# Patient Record
Sex: Female | Born: 1940 | Race: Black or African American | Hispanic: No | Marital: Married | Smoking: Former smoker
Health system: Southern US, Community
[De-identification: ages and names within clinical notes are randomized; demographics above are authoritative.]

## PROBLEM LIST (undated history)

## (undated) HISTORY — PX: ABDOMINAL HYSTERECTOMY: SHX81

## (undated) HISTORY — PX: OOPHORECTOMY: SHX86

---

## 2006-11-15 ENCOUNTER — Ambulatory Visit: Payer: Self-pay | Admitting: Family Medicine

## 2006-11-27 ENCOUNTER — Ambulatory Visit: Payer: Self-pay | Admitting: Family Medicine

## 2008-01-01 ENCOUNTER — Ambulatory Visit: Payer: Self-pay | Admitting: Family Medicine

## 2009-03-02 ENCOUNTER — Ambulatory Visit: Payer: Self-pay | Admitting: Family Medicine

## 2010-01-08 ENCOUNTER — Ambulatory Visit: Payer: Self-pay | Admitting: Family Medicine

## 2010-03-08 ENCOUNTER — Ambulatory Visit: Payer: Self-pay | Admitting: Family Medicine

## 2011-04-05 ENCOUNTER — Ambulatory Visit: Payer: Self-pay | Admitting: Family Medicine

## 2013-06-11 ENCOUNTER — Ambulatory Visit: Payer: Self-pay | Admitting: Family Medicine

## 2014-10-28 ENCOUNTER — Ambulatory Visit: Payer: Self-pay | Admitting: Family Medicine

## 2015-10-22 ENCOUNTER — Other Ambulatory Visit: Payer: Self-pay | Admitting: Family Medicine

## 2015-10-22 DIAGNOSIS — Z1231 Encounter for screening mammogram for malignant neoplasm of breast: Secondary | ICD-10-CM

## 2015-11-03 ENCOUNTER — Ambulatory Visit
Admission: RE | Admit: 2015-11-03 | Discharge: 2015-11-03 | Disposition: A | Discharge status: Home / Self Care | Payer: Medicare Other | Source: Ambulatory Visit | Attending: Family Medicine | Admitting: Family Medicine

## 2015-11-03 DIAGNOSIS — Z1231 Encounter for screening mammogram for malignant neoplasm of breast: Secondary | ICD-10-CM | POA: Diagnosis present

## 2015-11-09 ENCOUNTER — Other Ambulatory Visit: Payer: Self-pay | Admitting: Family Medicine

## 2015-11-09 DIAGNOSIS — R928 Other abnormal and inconclusive findings on diagnostic imaging of breast: Secondary | ICD-10-CM

## 2015-11-18 ENCOUNTER — Ambulatory Visit
Admission: RE | Admit: 2015-11-18 | Discharge: 2015-11-18 | Disposition: A | Discharge status: Home / Self Care | Payer: Medicare Other | Source: Ambulatory Visit | Attending: Family Medicine | Admitting: Family Medicine

## 2015-11-18 DIAGNOSIS — N63 Unspecified lump in breast: Secondary | ICD-10-CM | POA: Insufficient documentation

## 2015-11-18 DIAGNOSIS — R928 Other abnormal and inconclusive findings on diagnostic imaging of breast: Secondary | ICD-10-CM

## 2016-11-23 ENCOUNTER — Other Ambulatory Visit: Payer: Self-pay | Admitting: Family Medicine

## 2016-11-23 DIAGNOSIS — Z1231 Encounter for screening mammogram for malignant neoplasm of breast: Secondary | ICD-10-CM

## 2016-12-08 ENCOUNTER — Ambulatory Visit: Payer: Medicare Other | Attending: Family Medicine

## 2017-01-06 ENCOUNTER — Ambulatory Visit
Admission: RE | Admit: 2017-01-06 | Discharge: 2017-01-06 | Disposition: A | Discharge status: Home / Self Care | Payer: Medicare Other | Source: Ambulatory Visit | Attending: Family Medicine | Admitting: Family Medicine

## 2017-01-06 DIAGNOSIS — Z1231 Encounter for screening mammogram for malignant neoplasm of breast: Secondary | ICD-10-CM | POA: Diagnosis present

## 2017-07-19 DIAGNOSIS — J302 Other seasonal allergic rhinitis: Secondary | ICD-10-CM | POA: Insufficient documentation

## 2017-07-20 DIAGNOSIS — R7989 Other specified abnormal findings of blood chemistry: Secondary | ICD-10-CM | POA: Insufficient documentation

## 2018-01-16 ENCOUNTER — Other Ambulatory Visit: Payer: Self-pay | Admitting: Family Medicine

## 2018-01-16 DIAGNOSIS — Z1239 Encounter for other screening for malignant neoplasm of breast: Secondary | ICD-10-CM

## 2018-01-19 ENCOUNTER — Ambulatory Visit
Admission: RE | Admit: 2018-01-19 | Discharge: 2018-01-19 | Disposition: A | Discharge status: Home / Self Care | Payer: Medicare Other | Source: Ambulatory Visit | Attending: Family Medicine | Admitting: Family Medicine

## 2018-01-19 DIAGNOSIS — Z1231 Encounter for screening mammogram for malignant neoplasm of breast: Secondary | ICD-10-CM | POA: Insufficient documentation

## 2018-01-19 DIAGNOSIS — Z1239 Encounter for other screening for malignant neoplasm of breast: Secondary | ICD-10-CM

## 2018-08-22 DIAGNOSIS — I739 Peripheral vascular disease, unspecified: Secondary | ICD-10-CM | POA: Insufficient documentation

## 2018-08-24 DIAGNOSIS — M4802 Spinal stenosis, cervical region: Secondary | ICD-10-CM | POA: Insufficient documentation

## 2018-09-05 DIAGNOSIS — R55 Syncope and collapse: Secondary | ICD-10-CM | POA: Insufficient documentation

## 2019-06-06 ENCOUNTER — Other Ambulatory Visit: Payer: Self-pay | Admitting: Internal Medicine

## 2019-06-06 DIAGNOSIS — N183 Chronic kidney disease, stage 3 unspecified: Secondary | ICD-10-CM | POA: Insufficient documentation

## 2019-06-06 DIAGNOSIS — Z1231 Encounter for screening mammogram for malignant neoplasm of breast: Secondary | ICD-10-CM

## 2019-07-03 ENCOUNTER — Other Ambulatory Visit: Payer: Self-pay

## 2019-07-03 ENCOUNTER — Ambulatory Visit
Admission: RE | Admit: 2019-07-03 | Discharge: 2019-07-03 | Disposition: A | Discharge status: Home / Self Care | Payer: Medicare Other | Source: Ambulatory Visit | Attending: Internal Medicine | Admitting: Internal Medicine

## 2019-07-03 DIAGNOSIS — Z1231 Encounter for screening mammogram for malignant neoplasm of breast: Secondary | ICD-10-CM | POA: Diagnosis not present

## 2019-12-11 DIAGNOSIS — M7542 Impingement syndrome of left shoulder: Secondary | ICD-10-CM | POA: Insufficient documentation

## 2019-12-11 DIAGNOSIS — R1013 Epigastric pain: Secondary | ICD-10-CM | POA: Insufficient documentation

## 2020-04-27 ENCOUNTER — Encounter (INDEPENDENT_AMBULATORY_CARE_PROVIDER_SITE_OTHER): Payer: Self-pay | Admitting: Vascular Surgery

## 2020-04-27 ENCOUNTER — Other Ambulatory Visit: Payer: Self-pay

## 2020-04-27 ENCOUNTER — Ambulatory Visit (INDEPENDENT_AMBULATORY_CARE_PROVIDER_SITE_OTHER): Payer: Medicare PPO | Admitting: Vascular Surgery

## 2020-04-27 VITALS — BP 146/79 | HR 85 | Ht 65.0 in | Wt 237.0 lb

## 2020-04-27 DIAGNOSIS — I739 Peripheral vascular disease, unspecified: Secondary | ICD-10-CM

## 2020-04-27 DIAGNOSIS — E785 Hyperlipidemia, unspecified: Secondary | ICD-10-CM | POA: Insufficient documentation

## 2020-04-27 DIAGNOSIS — E559 Vitamin D deficiency, unspecified: Secondary | ICD-10-CM | POA: Insufficient documentation

## 2020-04-27 DIAGNOSIS — E782 Mixed hyperlipidemia: Secondary | ICD-10-CM

## 2020-04-27 DIAGNOSIS — I890 Lymphedema, not elsewhere classified: Secondary | ICD-10-CM | POA: Diagnosis not present

## 2020-04-29 ENCOUNTER — Other Ambulatory Visit (INDEPENDENT_AMBULATORY_CARE_PROVIDER_SITE_OTHER): Payer: Self-pay | Admitting: Vascular Surgery

## 2020-04-29 DIAGNOSIS — R0989 Other specified symptoms and signs involving the circulatory and respiratory systems: Secondary | ICD-10-CM

## 2020-04-29 DIAGNOSIS — I872 Venous insufficiency (chronic) (peripheral): Secondary | ICD-10-CM

## 2020-04-29 DIAGNOSIS — I890 Lymphedema, not elsewhere classified: Secondary | ICD-10-CM

## 2020-04-30 ENCOUNTER — Other Ambulatory Visit: Payer: Self-pay

## 2020-04-30 ENCOUNTER — Encounter (INDEPENDENT_AMBULATORY_CARE_PROVIDER_SITE_OTHER): Payer: Self-pay | Admitting: Vascular Surgery

## 2020-04-30 ENCOUNTER — Ambulatory Visit (INDEPENDENT_AMBULATORY_CARE_PROVIDER_SITE_OTHER): Payer: Medicare PPO

## 2020-04-30 ENCOUNTER — Ambulatory Visit (INDEPENDENT_AMBULATORY_CARE_PROVIDER_SITE_OTHER): Payer: Medicare PPO | Admitting: Vascular Surgery

## 2020-04-30 VITALS — BP 173/87 | HR 82 | Resp 16 | Wt 237.4 lb

## 2020-04-30 DIAGNOSIS — E782 Mixed hyperlipidemia: Secondary | ICD-10-CM | POA: Diagnosis not present

## 2020-04-30 DIAGNOSIS — R0989 Other specified symptoms and signs involving the circulatory and respiratory systems: Secondary | ICD-10-CM

## 2020-04-30 DIAGNOSIS — I890 Lymphedema, not elsewhere classified: Secondary | ICD-10-CM

## 2020-04-30 DIAGNOSIS — I872 Venous insufficiency (chronic) (peripheral): Secondary | ICD-10-CM

## 2020-04-30 DIAGNOSIS — I739 Peripheral vascular disease, unspecified: Secondary | ICD-10-CM | POA: Diagnosis not present

## 2020-04-30 NOTE — Progress Notes (Signed)
MRN : [id number]  Kathryn Nolan is a 79 y.o. ([birth year]) female who presents with chief complaint of  Chief Complaint  Patient presents with  . New Patient (Initial Visit)    Lyemphedema   .  History of Present Illness:   Patient is seen for evaluation of leg pain and leg swelling. The patient first noticed the swelling remotely. The swelling is associated with pain and discoloration. The pain and swelling worsens with prolonged dependency and improves with elevation. The pain is unrelated to activity.  The patient notes that in the morning the legs are significantly improved but they steadily worsened throughout the course of the day. The patient also notes a steady worsening of the discoloration in the ankle and shin area.   The patient denies claudication symptoms.  The patient denies symptoms consistent with rest pain.  The patient denies and extensive history of DJD and LS spine disease.  The patient has no had any past angiography, interventions or vascular surgery.  Elevation makes the leg symptoms better, dependency makes them much worse. There is no history of ulcerations. The patient denies any recent changes in medications.  The patient has not been wearing graduated compression.  The patient denies a history of DVT or PE. There is no prior history of phlebitis. There is no history of primary lymphedema.  No history of malignancies. No history of trauma or groin or pelvic surgery. There is no history of radiation treatment to the groin or pelvis  The patient denies amaurosis fugax or recent TIA symptoms. There are no recent neurological changes noted. The patient denies recent episodes of angina or shortness of breath  Current Meds  Medication Sig  . ascorbic acid (VITAMIN C) 500 MG tablet Take by mouth.  . Cholecalciferol 50 MCG (2000 UT) TABS Take by mouth.  . erythromycin ophthalmic ointment Place into both eyes as needed.   . loratadine (CLARITIN) 10 MG  tablet Take by mouth.  . meclizine (ANTIVERT) 25 MG tablet Take by mouth.  . triamcinolone ointment (KENALOG) 0.1 % Apply topically.    No past medical history on file.  Past Surgical History:  Procedure Laterality Date  . ABDOMINAL HYSTERECTOMY    . OOPHORECTOMY     1 ovary removed    Social History Social History   Tobacco Use  . Smoking status: Former Smoker    Types: Cigarettes    Start date: 04/27/2018  . Smokeless tobacco: Never Used  Substance Use Topics  . Alcohol use: Not on file  . Drug use: Not on file    Family History Family History  Problem Relation Age of Onset  . Breast cancer Neg Hx   No family history of bleeding/clotting disorders, porphyria or autoimmune disease   Allergies  Allergen Reactions  . Bacitracin-Neomycin-Polymyxin Rash  . Other Other (See Comments)    Contraindicated by renal insufficiency     REVIEW OF SYSTEMS (Negative unless checked)  Constitutional: [] Weight loss  [] Fever  [] Chills Cardiac: [] Chest pain   [] Chest pressure   [] Palpitations   [] Shortness of breath when laying flat   [] Shortness of breath with exertion. Vascular:  [x] Pain in legs with walking   [x] Pain in legs at rest  [] History of DVT   [] Phlebitis   [x] Swelling in legs   [] Varicose veins   [] Non-healing ulcers Pulmonary:   [] Uses home oxygen   [] Productive cough   [] Hemoptysis   [] Wheeze  [] COPD   [] Asthma Neurologic:  [] Dizziness   [] Seizures   []   History of stroke   [] History of TIA  [] Aphasia   [] Vissual changes   [] Weakness or numbness in arm   [] Weakness or numbness in leg Musculoskeletal:   [] Joint swelling   [x] Joint pain   [] Low back pain Hematologic:  [] Easy bruising  [] Easy bleeding   [] Hypercoagulable state   [] Anemic Gastrointestinal:  [] Diarrhea   [] Vomiting  [] Gastroesophageal reflux/heartburn   [] Difficulty swallowing. Genitourinary:  [] Chronic kidney disease   [] Difficult urination  [] Frequent urination   [] Blood in urine Skin:  [] Rashes   [] Ulcers    Psychological:  [] History of anxiety   []  History of major depression.  Physical Examination  Vitals:   04/27/20 0956  BP: (!) 146/79  Pulse: 85  Weight: 237 lb (107.5 kg)  Height: 5\' 5"  (1.651 m)   Body mass index is 39.44 kg/m. Gen: WD/WN, NAD Head: [place]/AT, No temporalis wasting.  Ear/Nose/Throat: Hearing grossly intact, nares w/o erythema or drainage, poor dentition Eyes: PER, EOMI, sclera nonicteric.  Neck: Supple, no masses.  No bruit or JVD.  Pulmonary:  Good air movement, clear to auscultation bilaterally, no use of accessory muscles.  Cardiac: RRR, normal S1, S2, no Murmurs. Vascular: scattered varicosities present bilaterally.  Mild venous stasis changes to the legs bilaterally.  3-4+ soft pitting edema bilaterally Vessel Right Left  Radial Palpable Palpable  PT Palpable Palpable  DP Palpable Palpable  Gastrointestinal: soft, non-distended. No guarding/no peritoneal signs.  Musculoskeletal: M/S 5/5 throughout.  No deformity or atrophy.  Neurologic: CN 2-12 intact. Pain and light touch intact in extremities.  Symmetrical.  Speech is fluent. Motor exam as listed above. Psychiatric: Judgment intact, Mood & affect appropriate for pt's clinical situation. Dermatologic: No rashes or ulcers noted.  No changes consistent with cellulitis. Lymph : No lichenification or skin changes of chronic lymphedema.  CBC No results found for: WBC, HGB, HCT, MCV, PLT  BMET No results found for: NA, K, CL, CO2, GLUCOSE, BUN, CREATININE, CALCIUM, GFRNONAA, GFRAA CrCl cannot be calculated (No successful lab value found.).  COAG No results found for: INR, PROTIME  Radiology No results found.    Assessment/Plan 1. PAD (peripheral artery disease) (HCC)  Recommend:  The patient has atypical pain symptoms for pure atherosclerotic disease. However, on physical exam there is evidence of mixed venous and arterial disease, given the diminished pulses and the edema associated with venous  changes of the legs.  Noninvasive studies including ABI's and venous ultrasound of the legs will be obtained and the patient will follow up with me to review these studies.  I suspect the patient is c/o pseudoclaudication.  Patient should have an evaluation of his LS spine which I defer to the primary service.  The patient should continue walking and begin a more formal exercise program. The patient should continue his antiplatelet therapy and aggressive treatment of the lipid abnormalities.  The patient should begin wearing graduated compression socks 15-20 mmHg strength to control edema.   - VAS ABI WITH/WO TBI; Future  2. Lymphedema  Recommend:  The patient has atypical pain symptoms for pure atherosclerotic disease. However, on physical exam there is evidence of mixed venous and arterial disease, given the diminished pulses and the edema associated with venous changes of the legs.  Noninvasive studies including ABI's and venous ultrasound of the legs will be obtained and the patient will follow up with me to review these studies.  I suspect the patient is c/o pseudoclaudication.  Patient should have an evaluation of his LS spine which  I defer to the primary service.  The patient should continue walking and begin a more formal exercise program. The patient should continue his antiplatelet therapy and aggressive treatment of the lipid abnormalities.  The patient should begin wearing graduated compression socks 15-20 mmHg strength to control edema.   - VAS [place] LOWER EXTREMITY VENOUS REFLUX; Future  3. Mixed hyperlipidemia Continue statin as ordered and reviewed, no changes at this time     Levora Dredge, MD  04/30/2020 12:40 PM

## 2020-04-30 NOTE — Progress Notes (Signed)
MRN : [id number]  Kathryn Nolan is a 79 y.o. ([birth year]) female who presents with chief complaint of  Chief Complaint  Patient presents with  . Follow-up    ultrasound follow up  .  History of Present Illness:   The patient returns to the office for followup and review of the noninvasive studies. There have been no interval changes in lower extremity symptoms. No interval worsening of her leg swelling and pain symptoms. No new ulcers or wounds have occurred since the last visit.  There have been no significant changes to the patient's overall health care.  The patient denies amaurosis fugax or recent TIA symptoms. There are no recent neurological changes noted. The patient denies history of DVT, PE or superficial thrombophlebitis. The patient denies recent episodes of angina or shortness of breath.   ABI Rt=1.18 and Lt=1.15  Triphasic ankle signals bilaterally Duplex ultrasound of the venous system shows a widely patent deep venous system no acute or chronic DVT no reflux noted either  Current Meds  Medication Sig  . ascorbic acid (VITAMIN C) 500 MG tablet Take by mouth.  . Cholecalciferol 50 MCG (2000 UT) TABS Take by mouth.  . erythromycin ophthalmic ointment Place into both eyes as needed.   . loratadine (CLARITIN) 10 MG tablet Take by mouth.  . meclizine (ANTIVERT) 25 MG tablet Take by mouth.  . triamcinolone ointment (KENALOG) 0.1 % Apply topically.    No past medical history on file.  Past Surgical History:  Procedure Laterality Date  . ABDOMINAL HYSTERECTOMY    . OOPHORECTOMY     1 ovary removed    Social History Social History   Tobacco Use  . Smoking status: Former Smoker    Types: Cigarettes    Start date: 04/27/2018  . Smokeless tobacco: Never Used  Substance Use Topics  . Alcohol use: Not on file  . Drug use: Not on file    Family History Family History  Problem Relation Age of Onset  . Breast cancer Neg Hx     Allergies  Allergen  Reactions  . Bacitracin-Neomycin-Polymyxin Rash  . Other Other (See Comments)    Contraindicated by renal insufficiency     REVIEW OF SYSTEMS (Negative unless checked)  Constitutional: [] Weight loss  [] Fever  [] Chills Cardiac: [] Chest pain   [] Chest pressure   [] Palpitations   [] Shortness of breath when laying flat   [] Shortness of breath with exertion. Vascular:  [x] Pain in legs with walking   [x] Pain in legs at rest  [] History of DVT   [] Phlebitis   [x] Swelling in legs   [] Varicose veins   [] Non-healing ulcers Pulmonary:   [] Uses home oxygen   [] Productive cough   [] Hemoptysis   [] Wheeze  [] COPD   [] Asthma Neurologic:  [] Dizziness   [] Seizures   [] History of stroke   [] History of TIA  [] Aphasia   [] Vissual changes   [] Weakness or numbness in arm   [] Weakness or numbness in leg Musculoskeletal:   [] Joint swelling   [x] Joint pain   [] Low back pain Hematologic:  [] Easy bruising  [] Easy bleeding   [] Hypercoagulable state   [] Anemic Gastrointestinal:  [] Diarrhea   [] Vomiting  [] Gastroesophageal reflux/heartburn   [] Difficulty swallowing. Genitourinary:  [] Chronic kidney disease   [] Difficult urination  [] Frequent urination   [] Blood in urine Skin:  [] Rashes   [] Ulcers  Psychological:  [] History of anxiety   []  History of major depression.  Physical Examination  Vitals:   04/30/20 0925  BP: (!) 173/87  Pulse: 82  Resp: 16  Weight: 237 lb 6.4 oz (107.7 kg)   Body mass index is 39.51 kg/m. Gen: WD/WN, NAD Head: [place]/AT, No temporalis wasting.  Ear/Nose/Throat: Hearing grossly intact, nares w/o erythema or drainage Eyes: PER, EOMI, sclera nonicteric.  Neck: Supple, no large masses.   Pulmonary:  Good air movement, no audible wheezing bilaterally, no use of accessory muscles.  Cardiac: RRR, no JVD Vascular: scattered varicosities present bilaterally.  Moderate venous stasis changes to the legs bilaterally.  3+ soft pitting edema Vessel Right Left  Radial Palpable Palpable    Gastrointestinal: Non-distended. No guarding/no peritoneal signs.  Musculoskeletal: M/S 5/5 throughout.  No deformity or atrophy.  Neurologic: CN 2-12 intact. Symmetrical.  Speech is fluent. Motor exam as listed above. Psychiatric: Judgment intact, Mood & affect appropriate for pt's clinical situation. Dermatologic: No rashes or ulcers noted.  No changes consistent with cellulitis. Lymph : No lichenification or skin changes of chronic lymphedema.  CBC No results found for: WBC, HGB, HCT, MCV, PLT  BMET No results found for: NA, K, CL, CO2, GLUCOSE, BUN, CREATININE, CALCIUM, GFRNONAA, GFRAA CrCl cannot be calculated (No successful lab value found.).  COAG No results found for: INR, PROTIME  Radiology No results found.   Assessment/Plan 1. Lymphedema I have had a long discussion with the patient regarding swelling and why it  causes symptoms.  Patient will begin wearing graduated compression stockings class 1 (20-30 mmHg) on a daily basis a prescription was given. The patient will  beginning wearing the stockings first thing in the morning and removing them in the evening. The patient is instructed specifically not to sleep in the stockings.   In addition, behavioral modification will be initiated.  This will include frequent elevation, use of over the counter pain medications and exercise such as walking.  I have reviewed systemic causes for chronic edema such as liver, kidney and cardiac etiologies.  The patient denies problems with these organ systems.    Consideration for a lymph pump will also be made based upon the effectiveness of conservative therapy.  This would help to improve the edema control and prevent sequela such as ulcers and infections   Duplex ultrasound of the venous system shows a widely patent deep venous system no acute or chronic DVT no reflux noted either  The patient will follow-up with me in 6 months to see if a lymph pump should be added    2. PAD  (peripheral artery disease) (HCC) Recommend:  I do not find evidence of life style limiting vascular disease. The patient specifically denies life style limitation.  Previous noninvasive studies including ABI's of the legs do not identify critical vascular problems.  The patient should continue walking and begin a more formal exercise program. The patient should continue his antiplatelet therapy and aggressive treatment of the lipid abnormalities.  The patient should begin wearing graduated compression socks 15-20 mmHg strength to control her mild edema.  ABI Rt=1.18 and Lt=1.15  Triphasic ankle signals bilaterally  3. Mixed hyperlipidemia Continue statin as ordered and reviewed, no changes at this time    Levora Dredge, MD  04/30/2020 9:33 AM

## 2020-06-24 ENCOUNTER — Other Ambulatory Visit: Payer: Self-pay | Admitting: Internal Medicine

## 2020-06-24 DIAGNOSIS — Z1231 Encounter for screening mammogram for malignant neoplasm of breast: Secondary | ICD-10-CM

## 2020-07-14 ENCOUNTER — Ambulatory Visit
Admission: RE | Admit: 2020-07-14 | Discharge: 2020-07-14 | Disposition: A | Discharge status: Home / Self Care | Payer: Medicare PPO | Source: Ambulatory Visit | Attending: Internal Medicine | Admitting: Internal Medicine

## 2020-07-14 ENCOUNTER — Other Ambulatory Visit: Payer: Self-pay

## 2020-07-14 DIAGNOSIS — Z1231 Encounter for screening mammogram for malignant neoplasm of breast: Secondary | ICD-10-CM | POA: Diagnosis not present

## 2020-07-20 ENCOUNTER — Other Ambulatory Visit: Payer: Self-pay | Admitting: Internal Medicine

## 2020-07-20 DIAGNOSIS — R928 Other abnormal and inconclusive findings on diagnostic imaging of breast: Secondary | ICD-10-CM

## 2020-07-20 DIAGNOSIS — N6489 Other specified disorders of breast: Secondary | ICD-10-CM

## 2020-07-29 ENCOUNTER — Ambulatory Visit
Admission: RE | Admit: 2020-07-29 | Discharge: 2020-07-29 | Disposition: A | Discharge status: Home / Self Care | Payer: Medicare PPO | Source: Ambulatory Visit | Attending: Internal Medicine | Admitting: Internal Medicine

## 2020-07-29 DIAGNOSIS — R928 Other abnormal and inconclusive findings on diagnostic imaging of breast: Secondary | ICD-10-CM

## 2020-07-29 DIAGNOSIS — N6489 Other specified disorders of breast: Secondary | ICD-10-CM | POA: Diagnosis present

## 2020-09-25 ENCOUNTER — Ambulatory Visit: Payer: Medicare PPO

## 2020-10-08 LAB — COLOGUARD: COLOGUARD: POSITIVE — AB

## 2020-10-08 LAB — EXTERNAL GENERIC LAB PROCEDURE: COLOGUARD: POSITIVE — AB

## 2020-11-09 ENCOUNTER — Ambulatory Visit (INDEPENDENT_AMBULATORY_CARE_PROVIDER_SITE_OTHER): Payer: Medicare PPO | Admitting: Nurse Practitioner

## 2020-11-23 ENCOUNTER — Ambulatory Visit (INDEPENDENT_AMBULATORY_CARE_PROVIDER_SITE_OTHER): Payer: Medicare PPO | Admitting: Nurse Practitioner

## 2021-06-23 ENCOUNTER — Other Ambulatory Visit: Payer: Self-pay | Admitting: Gerontology

## 2021-06-23 DIAGNOSIS — Z1231 Encounter for screening mammogram for malignant neoplasm of breast: Secondary | ICD-10-CM

## 2021-07-19 ENCOUNTER — Other Ambulatory Visit: Payer: Self-pay

## 2021-07-19 ENCOUNTER — Ambulatory Visit
Admission: RE | Admit: 2021-07-19 | Discharge: 2021-07-19 | Disposition: A | Discharge status: Home / Self Care | Payer: Medicare PPO | Source: Ambulatory Visit | Attending: Gerontology | Admitting: Gerontology

## 2021-07-19 DIAGNOSIS — Z1231 Encounter for screening mammogram for malignant neoplasm of breast: Secondary | ICD-10-CM | POA: Diagnosis present

## 2022-08-19 ENCOUNTER — Other Ambulatory Visit: Payer: Self-pay | Admitting: Gerontology

## 2022-08-19 DIAGNOSIS — Z1231 Encounter for screening mammogram for malignant neoplasm of breast: Secondary | ICD-10-CM

## 2022-09-26 ENCOUNTER — Ambulatory Visit
Admission: RE | Admit: 2022-09-26 | Discharge: 2022-09-26 | Disposition: A | Discharge status: Home / Self Care | Payer: Medicare PPO | Source: Ambulatory Visit | Attending: Gerontology | Admitting: Gerontology

## 2022-09-26 DIAGNOSIS — Z1231 Encounter for screening mammogram for malignant neoplasm of breast: Secondary | ICD-10-CM | POA: Insufficient documentation

## 2023-01-02 DIAGNOSIS — E538 Deficiency of other specified B group vitamins: Secondary | ICD-10-CM | POA: Diagnosis not present

## 2023-02-03 DIAGNOSIS — D510 Vitamin B12 deficiency anemia due to intrinsic factor deficiency: Secondary | ICD-10-CM | POA: Diagnosis not present

## 2023-02-07 DIAGNOSIS — H02889 Meibomian gland dysfunction of unspecified eye, unspecified eyelid: Secondary | ICD-10-CM | POA: Diagnosis not present

## 2023-02-07 DIAGNOSIS — Z961 Presence of intraocular lens: Secondary | ICD-10-CM | POA: Diagnosis not present

## 2023-02-07 DIAGNOSIS — H26493 Other secondary cataract, bilateral: Secondary | ICD-10-CM | POA: Diagnosis not present

## 2023-02-07 DIAGNOSIS — H1045 Other chronic allergic conjunctivitis: Secondary | ICD-10-CM | POA: Diagnosis not present

## 2023-02-23 DIAGNOSIS — E782 Mixed hyperlipidemia: Secondary | ICD-10-CM | POA: Diagnosis not present

## 2023-02-23 DIAGNOSIS — J302 Other seasonal allergic rhinitis: Secondary | ICD-10-CM | POA: Diagnosis not present

## 2023-02-23 DIAGNOSIS — N183 Chronic kidney disease, stage 3 unspecified: Secondary | ICD-10-CM | POA: Diagnosis not present

## 2023-02-23 DIAGNOSIS — I890 Lymphedema, not elsewhere classified: Secondary | ICD-10-CM | POA: Diagnosis not present

## 2023-02-23 DIAGNOSIS — E559 Vitamin D deficiency, unspecified: Secondary | ICD-10-CM | POA: Diagnosis not present

## 2023-02-23 DIAGNOSIS — R7303 Prediabetes: Secondary | ICD-10-CM | POA: Diagnosis not present

## 2023-02-23 DIAGNOSIS — Z09 Encounter for follow-up examination after completed treatment for conditions other than malignant neoplasm: Secondary | ICD-10-CM | POA: Diagnosis not present

## 2023-02-23 DIAGNOSIS — I130 Hypertensive heart and chronic kidney disease with heart failure and stage 1 through stage 4 chronic kidney disease, or unspecified chronic kidney disease: Secondary | ICD-10-CM | POA: Diagnosis not present

## 2023-02-23 DIAGNOSIS — I700 Atherosclerosis of aorta: Secondary | ICD-10-CM | POA: Diagnosis not present

## 2023-03-24 DIAGNOSIS — R399 Unspecified symptoms and signs involving the genitourinary system: Secondary | ICD-10-CM | POA: Diagnosis not present

## 2023-03-29 DIAGNOSIS — E782 Mixed hyperlipidemia: Secondary | ICD-10-CM | POA: Diagnosis not present

## 2023-03-29 DIAGNOSIS — M1712 Unilateral primary osteoarthritis, left knee: Secondary | ICD-10-CM | POA: Diagnosis not present

## 2023-03-29 DIAGNOSIS — R7303 Prediabetes: Secondary | ICD-10-CM | POA: Diagnosis not present

## 2023-03-29 DIAGNOSIS — M1711 Unilateral primary osteoarthritis, right knee: Secondary | ICD-10-CM | POA: Diagnosis not present

## 2023-03-29 DIAGNOSIS — I10 Essential (primary) hypertension: Secondary | ICD-10-CM | POA: Diagnosis not present

## 2023-03-29 DIAGNOSIS — N183 Chronic kidney disease, stage 3 unspecified: Secondary | ICD-10-CM | POA: Diagnosis not present

## 2023-03-29 DIAGNOSIS — I700 Atherosclerosis of aorta: Secondary | ICD-10-CM | POA: Diagnosis not present

## 2023-06-07 IMAGING — MG MM DIGITAL SCREENING BILAT W/ TOMO AND CAD
8 series · 8 of 24 positions shown · non-contrast
Comparison: Previous exam(s).

CLINICAL DATA: Screening.

EXAM:
DIGITAL SCREENING BILATERAL MAMMOGRAM WITH TOMOSYNTHESIS AND CAD
TECHNIQUE: Bilateral screening digital craniocaudal and mediolateral oblique
mammograms were obtained. Bilateral screening digital breast
tomosynthesis was performed. The images were evaluated with
computer-aided detection.

[R CC synth-2D]
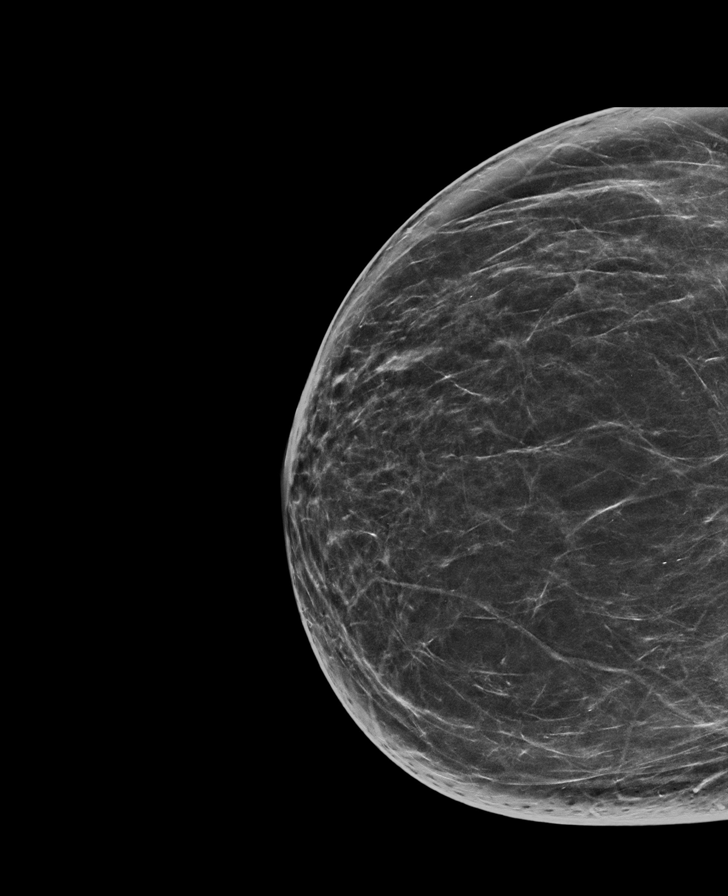

[R MLO synth-2D]
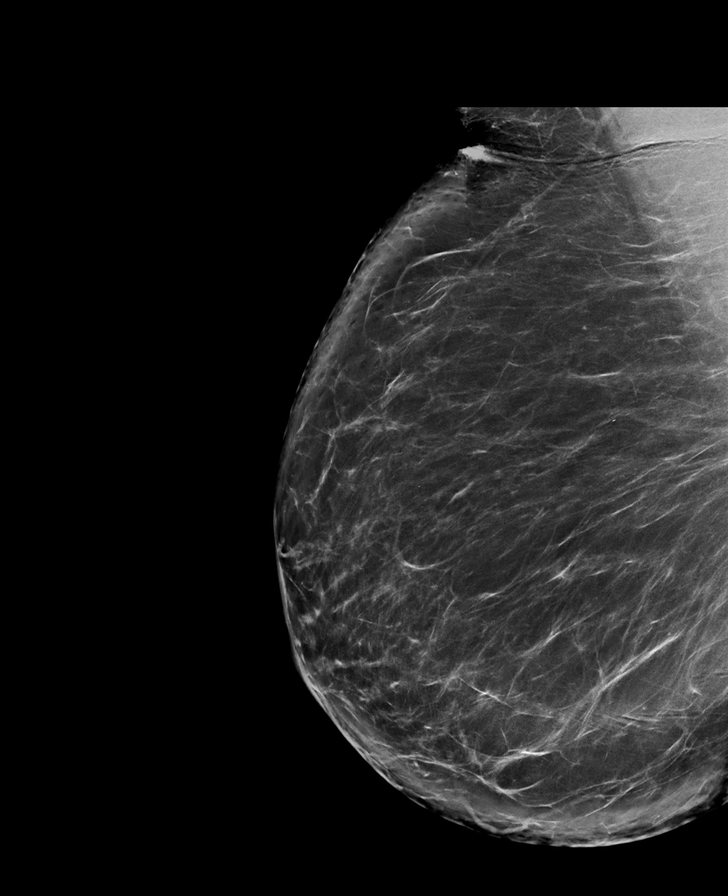

[L CC synth-2D]
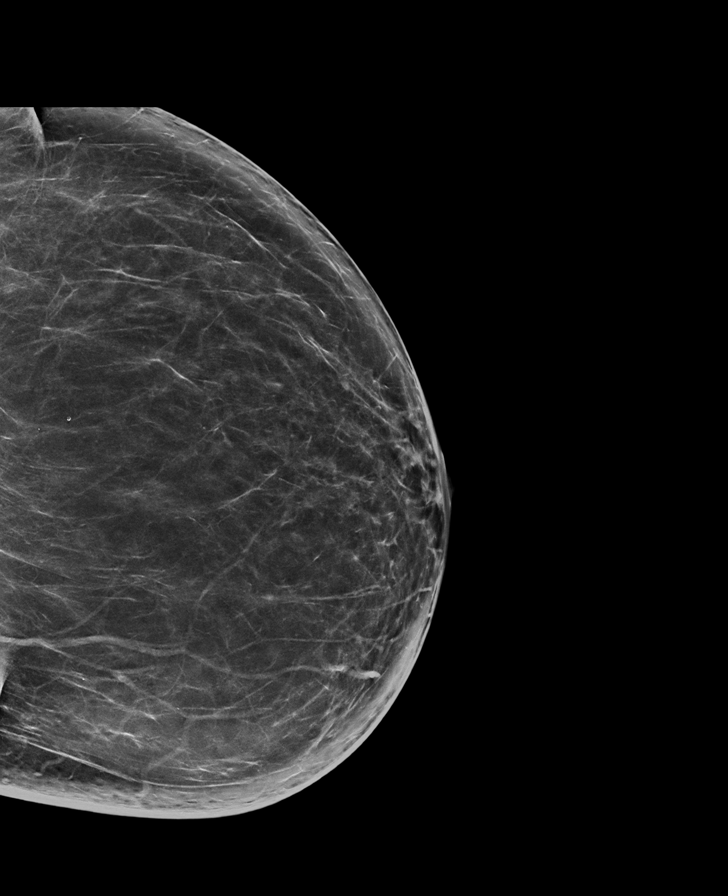

[L MLO synth-2D]
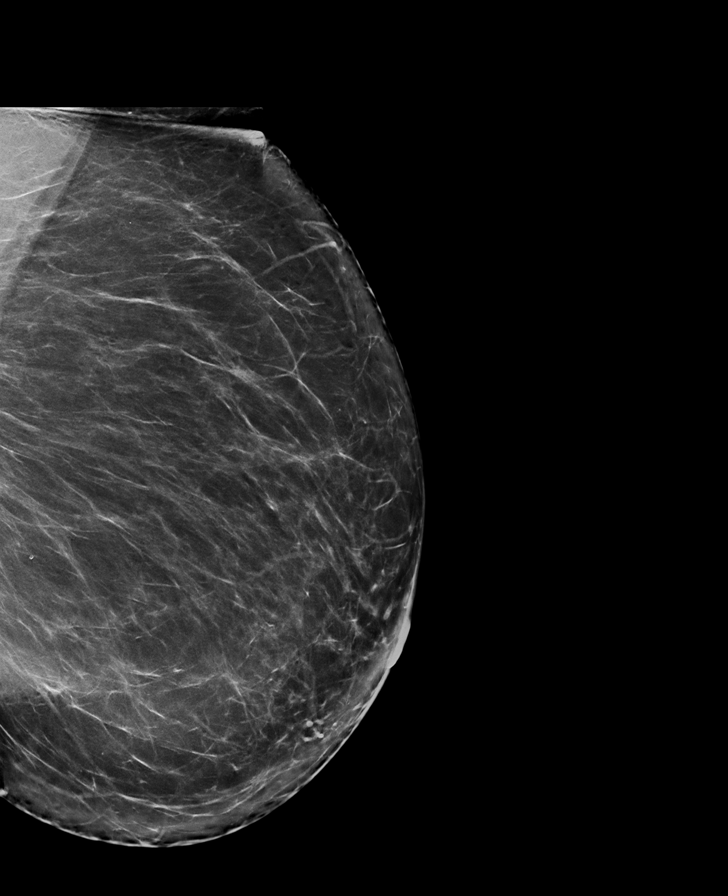

[R MLO tomo · tomo slice 49/96.0]
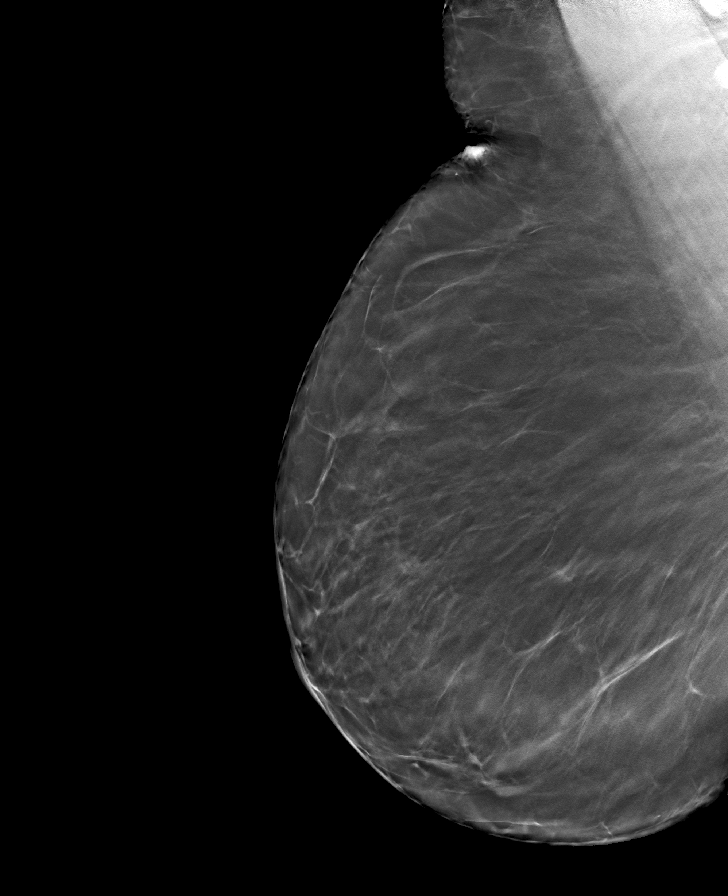

[L CC tomo · tomo slice 44/87.0]
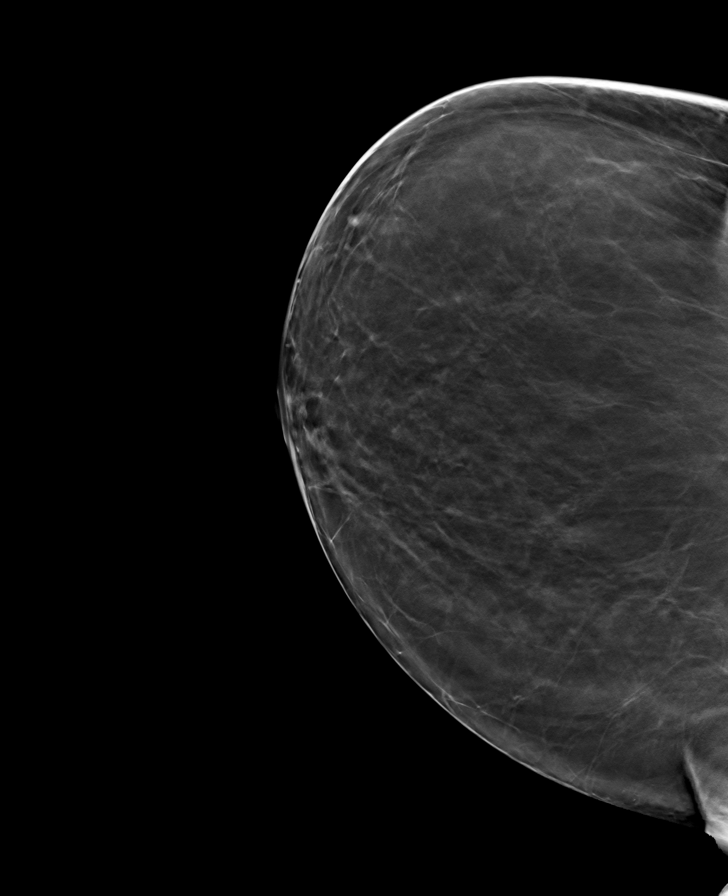

[R CC tomo · tomo slice 41/81.0]
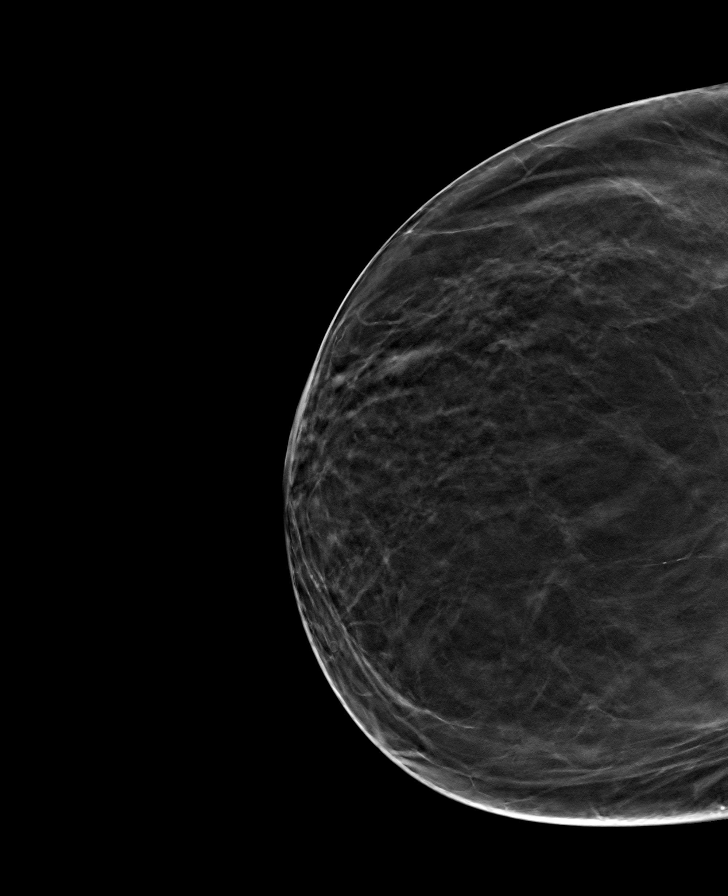

[L MLO tomo · tomo slice 47/94.0]
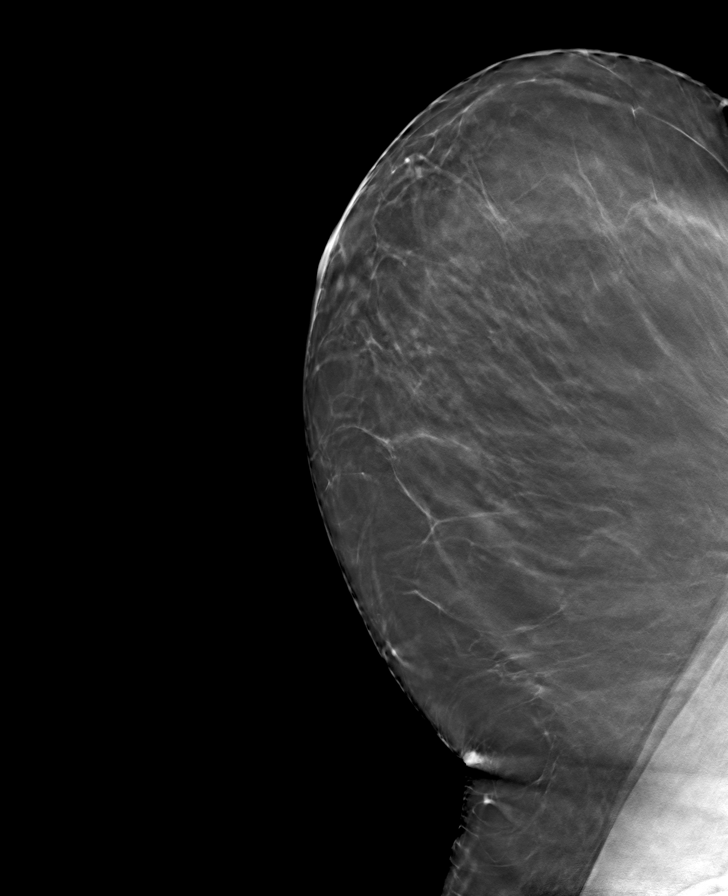

[8 of 24 positions shown; findings below may reference images not displayed]

ACR Breast Density Category b: There are scattered areas of
fibroglandular density.
FINDINGS: There are no findings suspicious for malignancy.
IMPRESSION: No mammographic evidence of malignancy. A result letter of this
screening mammogram will be mailed directly to the patient.

RECOMMENDATION:
Screening mammogram in one year. (Code:51-O-LD2)

BI-RADS CATEGORY  1: Negative.

## 2023-08-22 ENCOUNTER — Other Ambulatory Visit: Payer: Self-pay | Admitting: Gerontology

## 2023-08-22 DIAGNOSIS — Z1231 Encounter for screening mammogram for malignant neoplasm of breast: Secondary | ICD-10-CM

## 2023-08-23 DIAGNOSIS — Z23 Encounter for immunization: Secondary | ICD-10-CM | POA: Diagnosis not present

## 2023-09-04 DIAGNOSIS — D510 Vitamin B12 deficiency anemia due to intrinsic factor deficiency: Secondary | ICD-10-CM | POA: Diagnosis not present

## 2023-09-07 DIAGNOSIS — J302 Other seasonal allergic rhinitis: Secondary | ICD-10-CM | POA: Diagnosis not present

## 2023-09-07 DIAGNOSIS — I700 Atherosclerosis of aorta: Secondary | ICD-10-CM | POA: Diagnosis not present

## 2023-09-07 DIAGNOSIS — I10 Essential (primary) hypertension: Secondary | ICD-10-CM | POA: Diagnosis not present

## 2023-09-07 DIAGNOSIS — I890 Lymphedema, not elsewhere classified: Secondary | ICD-10-CM | POA: Diagnosis not present

## 2023-09-07 DIAGNOSIS — E782 Mixed hyperlipidemia: Secondary | ICD-10-CM | POA: Diagnosis not present

## 2023-09-07 DIAGNOSIS — Z1331 Encounter for screening for depression: Secondary | ICD-10-CM | POA: Diagnosis not present

## 2023-09-07 DIAGNOSIS — N183 Chronic kidney disease, stage 3 unspecified: Secondary | ICD-10-CM | POA: Diagnosis not present

## 2023-09-07 DIAGNOSIS — I1310 Hypertensive heart and chronic kidney disease without heart failure, with stage 1 through stage 4 chronic kidney disease, or unspecified chronic kidney disease: Secondary | ICD-10-CM | POA: Diagnosis not present

## 2023-09-07 DIAGNOSIS — Z1389 Encounter for screening for other disorder: Secondary | ICD-10-CM | POA: Diagnosis not present

## 2023-09-07 DIAGNOSIS — E559 Vitamin D deficiency, unspecified: Secondary | ICD-10-CM | POA: Diagnosis not present

## 2023-09-07 DIAGNOSIS — D510 Vitamin B12 deficiency anemia due to intrinsic factor deficiency: Secondary | ICD-10-CM | POA: Diagnosis not present

## 2023-09-07 DIAGNOSIS — R7303 Prediabetes: Secondary | ICD-10-CM | POA: Diagnosis not present

## 2023-09-07 DIAGNOSIS — Z0000 Encounter for general adult medical examination without abnormal findings: Secondary | ICD-10-CM | POA: Diagnosis not present

## 2023-10-03 ENCOUNTER — Ambulatory Visit
Admission: RE | Admit: 2023-10-03 | Discharge: 2023-10-03 | Disposition: A | Discharge status: Home / Self Care | Payer: Medicare PPO | Source: Ambulatory Visit | Attending: Gerontology | Admitting: Gerontology

## 2023-10-03 DIAGNOSIS — Z1231 Encounter for screening mammogram for malignant neoplasm of breast: Secondary | ICD-10-CM | POA: Insufficient documentation

## 2023-10-04 DIAGNOSIS — M9903 Segmental and somatic dysfunction of lumbar region: Secondary | ICD-10-CM | POA: Diagnosis not present

## 2023-10-04 DIAGNOSIS — M9904 Segmental and somatic dysfunction of sacral region: Secondary | ICD-10-CM | POA: Diagnosis not present

## 2023-10-04 DIAGNOSIS — M9902 Segmental and somatic dysfunction of thoracic region: Secondary | ICD-10-CM | POA: Diagnosis not present

## 2023-10-04 DIAGNOSIS — D510 Vitamin B12 deficiency anemia due to intrinsic factor deficiency: Secondary | ICD-10-CM | POA: Diagnosis not present

## 2023-10-04 DIAGNOSIS — M9905 Segmental and somatic dysfunction of pelvic region: Secondary | ICD-10-CM | POA: Diagnosis not present

## 2023-10-04 DIAGNOSIS — M5417 Radiculopathy, lumbosacral region: Secondary | ICD-10-CM | POA: Diagnosis not present

## 2023-10-04 DIAGNOSIS — E538 Deficiency of other specified B group vitamins: Secondary | ICD-10-CM | POA: Diagnosis not present

## 2023-10-11 DIAGNOSIS — M9902 Segmental and somatic dysfunction of thoracic region: Secondary | ICD-10-CM | POA: Diagnosis not present

## 2023-10-11 DIAGNOSIS — M9903 Segmental and somatic dysfunction of lumbar region: Secondary | ICD-10-CM | POA: Diagnosis not present

## 2023-10-11 DIAGNOSIS — M9904 Segmental and somatic dysfunction of sacral region: Secondary | ICD-10-CM | POA: Diagnosis not present

## 2023-10-11 DIAGNOSIS — M5417 Radiculopathy, lumbosacral region: Secondary | ICD-10-CM | POA: Diagnosis not present

## 2023-10-11 DIAGNOSIS — M9905 Segmental and somatic dysfunction of pelvic region: Secondary | ICD-10-CM | POA: Diagnosis not present

## 2023-10-23 DIAGNOSIS — M9903 Segmental and somatic dysfunction of lumbar region: Secondary | ICD-10-CM | POA: Diagnosis not present

## 2023-10-23 DIAGNOSIS — M9904 Segmental and somatic dysfunction of sacral region: Secondary | ICD-10-CM | POA: Diagnosis not present

## 2023-10-23 DIAGNOSIS — M5417 Radiculopathy, lumbosacral region: Secondary | ICD-10-CM | POA: Diagnosis not present

## 2023-10-23 DIAGNOSIS — M9905 Segmental and somatic dysfunction of pelvic region: Secondary | ICD-10-CM | POA: Diagnosis not present

## 2023-10-23 DIAGNOSIS — M9902 Segmental and somatic dysfunction of thoracic region: Secondary | ICD-10-CM | POA: Diagnosis not present

## 2023-11-06 DIAGNOSIS — D510 Vitamin B12 deficiency anemia due to intrinsic factor deficiency: Secondary | ICD-10-CM | POA: Diagnosis not present

## 2023-11-14 DIAGNOSIS — M9902 Segmental and somatic dysfunction of thoracic region: Secondary | ICD-10-CM | POA: Diagnosis not present

## 2023-11-14 DIAGNOSIS — M5417 Radiculopathy, lumbosacral region: Secondary | ICD-10-CM | POA: Diagnosis not present

## 2023-11-14 DIAGNOSIS — M9904 Segmental and somatic dysfunction of sacral region: Secondary | ICD-10-CM | POA: Diagnosis not present

## 2023-11-14 DIAGNOSIS — M9905 Segmental and somatic dysfunction of pelvic region: Secondary | ICD-10-CM | POA: Diagnosis not present

## 2023-11-14 DIAGNOSIS — M9903 Segmental and somatic dysfunction of lumbar region: Secondary | ICD-10-CM | POA: Diagnosis not present

## 2023-12-05 DIAGNOSIS — M9904 Segmental and somatic dysfunction of sacral region: Secondary | ICD-10-CM | POA: Diagnosis not present

## 2023-12-05 DIAGNOSIS — M9905 Segmental and somatic dysfunction of pelvic region: Secondary | ICD-10-CM | POA: Diagnosis not present

## 2023-12-05 DIAGNOSIS — M5417 Radiculopathy, lumbosacral region: Secondary | ICD-10-CM | POA: Diagnosis not present

## 2023-12-05 DIAGNOSIS — M9902 Segmental and somatic dysfunction of thoracic region: Secondary | ICD-10-CM | POA: Diagnosis not present

## 2023-12-05 DIAGNOSIS — M9903 Segmental and somatic dysfunction of lumbar region: Secondary | ICD-10-CM | POA: Diagnosis not present

## 2023-12-07 DIAGNOSIS — E538 Deficiency of other specified B group vitamins: Secondary | ICD-10-CM | POA: Diagnosis not present

## 2024-01-04 DIAGNOSIS — E538 Deficiency of other specified B group vitamins: Secondary | ICD-10-CM | POA: Diagnosis not present

## 2024-02-05 DIAGNOSIS — D510 Vitamin B12 deficiency anemia due to intrinsic factor deficiency: Secondary | ICD-10-CM | POA: Diagnosis not present

## 2024-02-13 DIAGNOSIS — H0288A Meibomian gland dysfunction right eye, upper and lower eyelids: Secondary | ICD-10-CM | POA: Diagnosis not present

## 2024-02-13 DIAGNOSIS — H0288B Meibomian gland dysfunction left eye, upper and lower eyelids: Secondary | ICD-10-CM | POA: Diagnosis not present

## 2024-02-13 DIAGNOSIS — H26493 Other secondary cataract, bilateral: Secondary | ICD-10-CM | POA: Diagnosis not present

## 2024-02-13 DIAGNOSIS — H1045 Other chronic allergic conjunctivitis: Secondary | ICD-10-CM | POA: Diagnosis not present

## 2024-02-13 DIAGNOSIS — Z961 Presence of intraocular lens: Secondary | ICD-10-CM | POA: Diagnosis not present

## 2024-02-19 DIAGNOSIS — N3000 Acute cystitis without hematuria: Secondary | ICD-10-CM | POA: Diagnosis not present

## 2024-02-19 DIAGNOSIS — R399 Unspecified symptoms and signs involving the genitourinary system: Secondary | ICD-10-CM | POA: Diagnosis not present

## 2024-03-07 DIAGNOSIS — D510 Vitamin B12 deficiency anemia due to intrinsic factor deficiency: Secondary | ICD-10-CM | POA: Diagnosis not present

## 2024-03-07 DIAGNOSIS — I130 Hypertensive heart and chronic kidney disease with heart failure and stage 1 through stage 4 chronic kidney disease, or unspecified chronic kidney disease: Secondary | ICD-10-CM | POA: Diagnosis not present

## 2024-03-07 DIAGNOSIS — E782 Mixed hyperlipidemia: Secondary | ICD-10-CM | POA: Diagnosis not present

## 2024-03-07 DIAGNOSIS — I509 Heart failure, unspecified: Secondary | ICD-10-CM | POA: Diagnosis not present

## 2024-03-07 DIAGNOSIS — I10 Essential (primary) hypertension: Secondary | ICD-10-CM | POA: Diagnosis not present

## 2024-03-07 DIAGNOSIS — J302 Other seasonal allergic rhinitis: Secondary | ICD-10-CM | POA: Diagnosis not present

## 2024-03-07 DIAGNOSIS — N183 Chronic kidney disease, stage 3 unspecified: Secondary | ICD-10-CM | POA: Diagnosis not present

## 2024-03-07 DIAGNOSIS — E559 Vitamin D deficiency, unspecified: Secondary | ICD-10-CM | POA: Diagnosis not present

## 2024-03-07 DIAGNOSIS — Z09 Encounter for follow-up examination after completed treatment for conditions other than malignant neoplasm: Secondary | ICD-10-CM | POA: Diagnosis not present

## 2024-04-08 DIAGNOSIS — D510 Vitamin B12 deficiency anemia due to intrinsic factor deficiency: Secondary | ICD-10-CM | POA: Diagnosis not present

## 2024-05-08 DIAGNOSIS — D510 Vitamin B12 deficiency anemia due to intrinsic factor deficiency: Secondary | ICD-10-CM | POA: Diagnosis not present

## 2024-06-10 DIAGNOSIS — D510 Vitamin B12 deficiency anemia due to intrinsic factor deficiency: Secondary | ICD-10-CM | POA: Diagnosis not present

## 2024-07-11 DIAGNOSIS — D510 Vitamin B12 deficiency anemia due to intrinsic factor deficiency: Secondary | ICD-10-CM | POA: Diagnosis not present

## 2024-08-13 DIAGNOSIS — D510 Vitamin B12 deficiency anemia due to intrinsic factor deficiency: Secondary | ICD-10-CM | POA: Diagnosis not present

## 2024-08-13 DIAGNOSIS — Z23 Encounter for immunization: Secondary | ICD-10-CM | POA: Diagnosis not present

## 2024-09-11 ENCOUNTER — Other Ambulatory Visit: Payer: Self-pay | Admitting: Gerontology

## 2024-09-11 DIAGNOSIS — Z1231 Encounter for screening mammogram for malignant neoplasm of breast: Secondary | ICD-10-CM

## 2024-09-18 DIAGNOSIS — J302 Other seasonal allergic rhinitis: Secondary | ICD-10-CM | POA: Diagnosis not present

## 2024-09-18 DIAGNOSIS — E782 Mixed hyperlipidemia: Secondary | ICD-10-CM | POA: Diagnosis not present

## 2024-09-18 DIAGNOSIS — I130 Hypertensive heart and chronic kidney disease with heart failure and stage 1 through stage 4 chronic kidney disease, or unspecified chronic kidney disease: Secondary | ICD-10-CM | POA: Diagnosis not present

## 2024-09-18 DIAGNOSIS — I890 Lymphedema, not elsewhere classified: Secondary | ICD-10-CM | POA: Diagnosis not present

## 2024-09-18 DIAGNOSIS — E559 Vitamin D deficiency, unspecified: Secondary | ICD-10-CM | POA: Diagnosis not present

## 2024-09-18 DIAGNOSIS — I509 Heart failure, unspecified: Secondary | ICD-10-CM | POA: Diagnosis not present

## 2024-09-18 DIAGNOSIS — I700 Atherosclerosis of aorta: Secondary | ICD-10-CM | POA: Diagnosis not present

## 2024-09-18 DIAGNOSIS — R7303 Prediabetes: Secondary | ICD-10-CM | POA: Diagnosis not present

## 2024-09-18 DIAGNOSIS — L918 Other hypertrophic disorders of the skin: Secondary | ICD-10-CM | POA: Diagnosis not present

## 2024-10-17 ENCOUNTER — Inpatient Hospital Stay: Admission: RE | Admit: 2024-10-17 | Discharge: 2024-10-17 | Attending: Gerontology

## 2024-10-17 DIAGNOSIS — Z1231 Encounter for screening mammogram for malignant neoplasm of breast: Secondary | ICD-10-CM | POA: Diagnosis present
